# Patient Record
Sex: Male | Born: 1995 | Race: Black or African American | Hispanic: No | Marital: Single | Smoking: Never smoker
Health system: Southern US, Community
[De-identification: ages and names within clinical notes are randomized; demographics above are authoritative.]

---

## 2014-12-07 ENCOUNTER — Ambulatory Visit
Admit: 2014-12-07 | Disposition: A | Discharge status: Home / Self Care | Payer: Self-pay | Attending: Family Medicine | Admitting: Family Medicine

## 2015-07-23 ENCOUNTER — Encounter: Payer: Self-pay | Admitting: Family Medicine

## 2015-07-23 ENCOUNTER — Ambulatory Visit (INDEPENDENT_AMBULATORY_CARE_PROVIDER_SITE_OTHER): Payer: 59 | Admitting: Family Medicine

## 2015-07-23 VITALS — BP 118/72 | HR 64 | Temp 98.0°F | Resp 16 | Ht 72.0 in | Wt 187.0 lb

## 2015-07-23 DIAGNOSIS — L0202 Furuncle of face: Secondary | ICD-10-CM

## 2015-07-23 MED ORDER — CLINDAMYCIN PHOSPHATE 1 % EX LOTN: TOPICAL_LOTION | Freq: Two times a day (BID) | CUTANEOUS | Status: DC

## 2015-07-23 NOTE — Progress Notes (Signed)
   Subjective:    Patient ID: Dickie LaMarcus Glover Saunders, male    DOB: 1995-10-01, 19 y.o.   MRN: [id number]  HPI: Dickie LaMarcus Glover Saunders is a 19 y.o. male presenting on 07/23/2015 for Cyst   HPI  Pt presents for growth on chin. Noticed 1 week ago. Was larger yesterday but swelling has improved. Put cortisone on it at home. No pus or drainage. No fevers. No trouble swallowing. Not painful.   No past medical history on file.  No current outpatient prescriptions on file prior to visit.   No current facility-administered medications on file prior to visit.    Review of Systems  Constitutional: Negative for fever and chills.  Respiratory: Negative for cough, chest tightness and shortness of breath.   Cardiovascular: Negative for chest pain, palpitations and leg swelling.  Skin: Positive for wound.   Per HPI unless specifically indicated above     Objective:    BP 118/72 mmHg  Pulse 64  Temp(Src) 98 F (36.7 C) (Oral)  Resp 16  Ht 6' (1.829 m)  Wt 187 lb (84.823 kg)  BMI 25.36 kg/m2  Wt Readings from Last 3 Encounters:  07/23/15 187 lb (84.823 kg)    Physical Exam  Constitutional: He appears well-developed and well-nourished. No distress.  Cardiovascular: Normal rate and regular rhythm.  Exam reveals no gallop and no friction rub.   No murmur heard. Pulmonary/Chest: Effort normal and breath sounds normal. He has no wheezes. He exhibits no tenderness.  Skin: Skin is warm and dry. Lesion noted. He is not diaphoretic. There is erythema. No pallor.  Erythematous lesion with 1cm diameter under the chin. No pustule.    No results found for this or any previous visit.    Assessment & Plan:   Problem List Items Addressed This Visit    None    Visit Diagnoses    Furuncle of chin    -  Primary    Warm compresses to open up the lesion. Topical clindamycin. Consider I and D if not improving with conservative care.     Relevant Medications    clindamycin (CLEOCIN T) 1 % lotion       Meds ordered this encounter  Medications  . clindamycin (CLEOCIN T) 1 % lotion    Sig: Apply topically 2 (two) times daily.    Dispense:  60 mL    Refill:  1    Order Specific Question:  Supervising Provider    Answer:  Janeann ForehandHAWKINS JR, JAMES H [[id number]]      Follow up plan: Return if symptoms worsen or fail to improve.

## 2015-07-23 NOTE — Patient Instructions (Signed)

## 2016-05-01 ENCOUNTER — Encounter: Payer: Self-pay | Admitting: Family Medicine

## 2016-05-01 ENCOUNTER — Ambulatory Visit (INDEPENDENT_AMBULATORY_CARE_PROVIDER_SITE_OTHER): Payer: BLUE CROSS/BLUE SHIELD | Admitting: Family Medicine

## 2016-05-01 VITALS — BP 108/71 | HR 71 | Temp 98.6°F | Resp 16 | Ht 70.5 in | Wt 190.6 lb

## 2016-05-01 DIAGNOSIS — Z23 Encounter for immunization: Secondary | ICD-10-CM | POA: Diagnosis not present

## 2016-05-01 DIAGNOSIS — Z202 Contact with and (suspected) exposure to infections with a predominantly sexual mode of transmission: Secondary | ICD-10-CM

## 2016-05-01 DIAGNOSIS — Z0000 Encounter for general adult medical examination without abnormal findings: Secondary | ICD-10-CM | POA: Diagnosis not present

## 2016-05-01 NOTE — Addendum Note (Signed)
Addended by: Elvina MattesPATEL, Wenceslaus Gist D on: 05/01/2016 11:34 AM   Modules accepted: Orders

## 2016-05-01 NOTE — Patient Instructions (Addendum)
I do recommend seeing Eye doctor to have your vision checked.   Health Maintenance, Male A healthy lifestyle and preventative care can promote health and wellness.  Maintain regular health, dental, and eye exams.  Eat a healthy diet. Foods like vegetables, fruits, whole grains, low-fat dairy products, and lean protein foods contain the nutrients you need and are low in calories. Decrease your intake of foods high in solid fats, added sugars, and salt. Get information about a proper diet from your health care provider, if necessary.  Regular physical exercise is one of the most important things you can do for your health. Most adults should get at least 150 minutes of moderate-intensity exercise (any activity that increases your heart rate and causes you to sweat) each week. In addition, most adults need muscle-strengthening exercises on 2 or more days a week.   Maintain a healthy weight. The body mass index (BMI) is a screening tool to identify possible weight problems. It provides an estimate of body fat based on height and weight. Your health care provider can find your BMI and can help you achieve or maintain a healthy weight. For males 20 years and older:  A BMI below 18.5 is considered underweight.  A BMI of 18.5 to 24.9 is normal.  A BMI of 25 to 29.9 is considered overweight.  A BMI of 30 and above is considered obese.  Maintain normal blood lipids and cholesterol by exercising and minimizing your intake of saturated fat. Eat a balanced diet with plenty of fruits and vegetables. Blood tests for lipids and cholesterol should begin at age 22 and be repeated every 5 years. If your lipid or cholesterol levels are high, you are over age 22, or you are at high risk for heart disease, you may need your cholesterol levels checked more frequently.Ongoing high lipid and cholesterol levels should be treated with medicines if diet and exercise are not working.  If you smoke, find out from your  health care provider how to quit. If you do not use tobacco, do not start.  Lung cancer screening is recommended for adults aged 55-80 years who are at high risk for developing lung cancer because of a history of smoking. A yearly low-dose CT scan of the lungs is recommended for people who have at least a 30-pack-year history of smoking and are current smokers or have quit within the past 15 years. A pack year of smoking is smoking an average of 1 pack of cigarettes a day for 1 year (for example, a 30-pack-year history of smoking could mean smoking 1 pack a day for 30 years or 2 packs a day for 15 years). Yearly screening should continue until the smoker has stopped smoking for at least 15 years. Yearly screening should be stopped for people who develop a health problem that would prevent them from having lung cancer treatment.  If you choose to drink alcohol, do not have more than 2 drinks per day. One drink is considered to be 12 oz (360 mL) of beer, 5 oz (150 mL) of wine, or 1.5 oz (45 mL) of liquor.  Avoid the use of street drugs. Do not share needles with anyone. Ask for help if you need support or instructions about stopping the use of drugs.  High blood pressure causes heart disease and increases the risk of stroke. High blood pressure is more likely to develop in:  People who have blood pressure in the end of the normal range (100-139/85-89 mm Hg).  People who are overweight or obese.  People who are African American.  If you are 14-73 years of age, have your blood pressure checked every 3-5 years. If you are 50 years of age or older, have your blood pressure checked every year. You should have your blood pressure measured twice--once when you are at a hospital or clinic, and once when you are not at a hospital or clinic. Record the average of the two measurements. To check your blood pressure when you are not at a hospital or clinic, you can use:  An automated blood pressure machine at a  pharmacy.  A home blood pressure monitor.  If you are 69-22 years old, ask your health care provider if you should take aspirin to prevent heart disease.  Diabetes screening involves taking a blood sample to check your fasting blood sugar level. This should be done once every 3 years after age 16 if you are at a normal weight and without risk factors for diabetes. Testing should be considered at a younger age or be carried out more frequently if you are overweight and have at least 1 risk factor for diabetes.  Colorectal cancer can be detected and often prevented. Most routine colorectal cancer screening begins at the age of 44 and continues through age 73. However, your health care provider may recommend screening at an earlier age if you have risk factors for colon cancer. On a yearly basis, your health care provider may provide home test kits to check for hidden blood in the stool. A small camera at the end of a tube may be used to directly examine the colon (sigmoidoscopy or colonoscopy) to detect the earliest forms of colorectal cancer. Talk to your health care provider about this at age 24 when routine screening begins. A direct exam of the colon should be repeated every 5-10 years through age 74, unless early forms of precancerous polyps or small growths are found.  People who are at an increased risk for hepatitis B should be screened for this virus. You are considered at high risk for hepatitis B if:  You were born in a country where hepatitis B occurs often. Talk with your health care provider about which countries are considered high risk.  Your parents were born in a high-risk country and you have not received a shot to protect against hepatitis B (hepatitis B vaccine).  You have HIV or AIDS.  You use needles to inject street drugs.  You live with, or have sex with, someone who has hepatitis B.  You are a man who has sex with other men (MSM).  You get hemodialysis  treatment.  You take certain medicines for conditions like cancer, organ transplantation, and autoimmune conditions.  Hepatitis C blood testing is recommended for all people born from [birth year] through 1967 and any individual with known risk factors for hepatitis C.  Healthy men should no longer receive prostate-specific antigen (PSA) blood tests as part of routine cancer screening. Talk to your health care provider about prostate cancer screening.  Testicular cancer screening is not recommended for adolescents or adult males who have no symptoms. Screening includes self-exam, a health care provider exam, and other screening tests. Consult with your health care provider about any symptoms you have or any concerns you have about testicular cancer.  Practice safe sex. Use condoms and avoid high-risk sexual practices to reduce the spread of sexually transmitted infections (STIs).  You should be screened for STIs, including gonorrhea and chlamydia if:  You are sexually active and are younger than 24 years.  You are older than 24 years, and your health care provider tells you that you are at risk for this type of infection.  Your sexual activity has changed since you were last screened, and you are at an increased risk for chlamydia or gonorrhea. Ask your health care provider if you are at risk.  If you are at risk of being infected with HIV, it is recommended that you take a prescription medicine daily to prevent HIV infection. This is called pre-exposure prophylaxis (PrEP). You are considered at risk if:  You are a man who has sex with other men (MSM).  You are a heterosexual man who is sexually active with multiple partners.  You take drugs by injection.  You are sexually active with a partner who has HIV.  Talk with your health care provider about whether you are at high risk of being infected with HIV. If you choose to begin PrEP, you should first be tested for HIV. You should then be tested  every 3 months for as long as you are taking PrEP.  Use sunscreen. Apply sunscreen liberally and repeatedly throughout the day. You should seek shade when your shadow is shorter than you. Protect yourself by wearing long sleeves, pants, a wide-brimmed hat, and sunglasses year round whenever you are outdoors.  Tell your health care provider of new moles or changes in moles, especially if there is a change in shape or color. Also, tell your health care provider if a mole is larger than the size of a pencil eraser.  A one-time screening for abdominal aortic aneurysm (AAA) and surgical repair of large AAAs by ultrasound is recommended for men aged 44-75 years who are current or former smokers.  Stay current with your vaccines (immunizations).   This information is not intended to replace advice given to you by your health care provider. Make sure you discuss any questions you have with your health care provider.   Document Released: 01/31/2008 Document Revised: 08/25/2014 Document Reviewed: 12/30/2010 Elsevier Interactive Patient Education [employer].

## 2016-05-01 NOTE — Progress Notes (Signed)
Subjective:    Patient ID: Thomas Pierce, male    DOB: 1995/10/04, 20 y.o.   MRN: [id number]  HPI: Thomas Pierce is a 20 y.o. male presenting on 05/01/2016 for Annual Exam   HPI  Pt presents for physical today. 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 Will be living in an apartment off campus. Would like a physical for clearance for sports. No history of fainting on the field. Has never had a concussion or head injury. No family history of sudden cardiac death. No history of heart defects or issues. No history of broken bones or injuries.  Currently sexually active. 1 partner. Would like STI testing.  Trouble seeing road signs at a distance. Has not had eye exam in several years.  TDAP last year.  Would like flu shot today.    No past medical history on file. Social History   Social History  . Marital status: Single    Spouse name: N/A  . Number of children: N/A  . Years of education: N/A   Occupational History  . Not on file.   Social History Main Topics  . Smoking status: Never Smoker  . Smokeless tobacco: Never Used  . Alcohol use 0.0 oz/week     Comment: Occasionally   . Drug use: No  . Sexual activity: Not on file   Other Topics Concern  . Not on file   Social History Narrative  . No narrative on file   Family History  Problem Relation Age of Onset  . Cancer Maternal Grandmother   . Diabetes Paternal Grandmother    No current outpatient prescriptions on file prior to visit.   No current facility-administered medications on file prior to visit.     Review of Systems  Constitutional: Negative for chills and fever.  HENT: Negative.   Respiratory: Negative for chest tightness, shortness of breath and wheezing.   Cardiovascular: Negative for chest pain, palpitations and leg swelling.  Gastrointestinal: Negative for abdominal pain, nausea and vomiting.  Endocrine: Negative.   Genitourinary: Negative for discharge, dysuria, penile  pain, testicular pain and urgency.  Musculoskeletal: Negative for arthralgias, back pain and joint swelling.  Skin: Negative.   Neurological: Negative for dizziness, weakness, numbness and headaches.  Psychiatric/Behavioral: Negative for dysphoric mood and sleep disturbance.   Per HPI unless specifically indicated above     Objective:    BP 108/71 (BP Location: Right Arm, Patient Position: Sitting, Cuff Size: Large)   Pulse 71   Temp 98.6 F (37 C) (Oral)   Resp 16   Ht 5' 10.5" (1.791 m)   Wt 190 lb 9.6 oz (86.5 kg)   BMI 26.96 kg/m   Wt Readings from Last 3 Encounters:  05/01/16 190 lb 9.6 oz (86.5 kg)  07/23/15 187 lb (84.8 kg)    Physical Exam  Constitutional: He is oriented to person, place, and time. He appears well-developed and well-nourished. No distress.  HENT:  Head: Normocephalic and atraumatic.  Neck: Neck supple. No thyromegaly present.  Cardiovascular: Normal rate, regular rhythm and normal heart sounds.  Exam reveals no gallop and no friction rub.   No murmur heard. Pulmonary/Chest: Effort normal and breath sounds normal. He has no wheezes.  Abdominal: Soft. Bowel sounds are normal. He exhibits no distension. There is no tenderness. There is no rebound.  Musculoskeletal: Normal range of motion. He exhibits no edema or tenderness.  Neurological: He is alert and oriented to person, place, and time. He has normal  reflexes.  Skin: Skin is warm and dry. No rash noted. No erythema.  Psychiatric: He has a normal mood and affect. His behavior is normal. Thought content normal.   No results found for this or any previous visit.    Assessment & Plan:   Problem List Items Addressed This Visit    None    Visit Diagnoses    Annual physical exam    -  Primary   Health maintenance reviewed. Cleared for sports. requesting vaccine records to college documentation.    Relevant Orders   COMPLETE METABOLIC PANEL WITH GFR   Lipid Profile   Possible exposure to STD        Pt would like STD testing today.    Relevant Orders   GC/Chlamydia Probe Amp   HIV antibody (with reflex)   Hepatitis C Antibody   Flu vaccine need       Relevant Orders   Flu Vaccine QUAD 36+ mos PF IM (Fluarix & Fluzone Quad PF) (Completed)      No orders of the defined types were placed in this encounter.     Follow up plan: No Follow-up on file.

## 2016-05-02 ENCOUNTER — Telehealth: Payer: Self-pay | Admitting: Family Medicine

## 2016-05-02 LAB — GC/CHLAMYDIA PROBE AMP
CT Probe RNA: NOT DETECTED
GC PROBE AMP APTIMA: NOT DETECTED

## 2016-05-02 NOTE — Telephone Encounter (Signed)
Called pt to let him know that [facility] has no record of him. He needs to have his mother check for vaccine records at home. If he cannot find records- he will need to contact [employer] to determine which vaccines are needed for school so we can draw titers.

## 2016-05-06 ENCOUNTER — Encounter: Payer: Self-pay | Admitting: Family Medicine

## 2016-06-05 ENCOUNTER — Telehealth: Payer: Self-pay | Admitting: *Deleted

## 2016-06-05 NOTE — Telephone Encounter (Signed)
Noted-jh 

## 2016-06-05 NOTE — Telephone Encounter (Signed)
Patient has scheduled office visit for spirometry. He need clearance for [employer] proving he doesn't have asthma.

## 2016-06-10 ENCOUNTER — Encounter: Payer: Self-pay | Admitting: Family Medicine

## 2016-06-10 ENCOUNTER — Telehealth: Payer: Self-pay | Admitting: *Deleted

## 2016-06-10 ENCOUNTER — Ambulatory Visit (INDEPENDENT_AMBULATORY_CARE_PROVIDER_SITE_OTHER): Payer: BLUE CROSS/BLUE SHIELD | Admitting: Family Medicine

## 2016-06-10 VITALS — BP 121/74 | HR 75 | Temp 98.6°F | Resp 16 | Ht 70.5 in | Wt 192.0 lb

## 2016-06-10 DIAGNOSIS — Z0189 Encounter for other specified special examinations: Secondary | ICD-10-CM | POA: Diagnosis not present

## 2016-06-10 NOTE — Telephone Encounter (Signed)
PFT scheduled for 06/17/16 patient needs to arrive at [facility].

## 2016-06-10 NOTE — Patient Instructions (Signed)
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 If further testing needed, he can repeat [facility] at [facility].

## 2016-06-10 NOTE — Progress Notes (Signed)
Name: Thomas Pierce   MRN: [id number]    DOB: 12/10/1995   Date:06/10/2016       Progress Note  Subjective  Chief Complaint  Chief Complaint  Patient presents with  . spirometry    HPI Here for documention that he does not have asthma for entering [employer].   He had PFTs done 11/2014 which showed no obstructive airway disease and no bronchodilator effect.  He did have mild restrictive airway disease. No problem-specific Assessment & Plan notes found for this encounter.   History reviewed. No pertinent past medical history.  Social History  Substance Use Topics  . Smoking status: Never Smoker  . Smokeless tobacco: Never Used  . Alcohol use 0.0 oz/week     Comment: Occasionally     No current outpatient prescriptions on file.  Not on File  Review of Systems  Constitutional: Negative.   Respiratory: Negative.   Cardiovascular: Negative.       Objective  Vitals:   06/10/16 1448  BP: 121/74  Pulse: 75  Resp: 16  Temp: 98.6 F (37 C)  TempSrc: Oral  Weight: 192 lb (87.1 kg)  Height: 5' 10.5" (1.791 m)     Physical Exam  Constitutional: He is well-developed, well-nourished, and in no distress. No distress.  Cardiovascular: Normal rate, regular rhythm and normal heart sounds.  Exam reveals no gallop and no friction rub.   No murmur heard. Pulmonary/Chest: Effort normal and breath sounds normal. No respiratory distress. He has no wheezes. He has no rales.  Vitals reviewed.     Recent Results (from the past 2160 hour(s))  GC/Chlamydia Probe Amp     Status: None   Collection Time: 05/01/16 11:31 AM  Result Value Ref Range   CT Probe RNA NOT DETECTED     Comment:                    **Normal Reference Range: NOT DETECTED**   This test was performed using the APTIMA COMBO2 Assay ([employer]   The analytical performance characteristics of this assay, when used to test SurePath specimens have been determined by [facility]      GC  Probe RNA NOT DETECTED     Comment:                    **Normal Reference Range: NOT DETECTED**   This test was performed using the APTIMA COMBO2 Assay ([employer]   The analytical performance characteristics of this assay, when used to test SurePath specimens have been determined by [facility]        Assessment & Plan  1. Encounter for respiratory clearance examination  - Spirometry with graph; Future - Spirometry with graph - Pulmonary Function Test [facility] Only; Future  He will carry PFT results from 2016 to recruiter.  If this clears him, then that ends evaluation.  If further testing needed, he can get repeat PTT[facility] at [facility] as ordered.

## 2016-06-17 ENCOUNTER — Ambulatory Visit: Payer: Self-pay

## 2016-08-14 ENCOUNTER — Ambulatory Visit: Payer: BLUE CROSS/BLUE SHIELD | Attending: Family Medicine

## 2016-08-14 DIAGNOSIS — Z0189 Encounter for other specified special examinations: Secondary | ICD-10-CM

## 2016-08-14 DIAGNOSIS — Z1383 Encounter for screening for respiratory disorder NEC: Secondary | ICD-10-CM | POA: Insufficient documentation

## 2016-09-04 ENCOUNTER — Encounter: Payer: Self-pay | Admitting: Family Medicine

## 2017-01-28 ENCOUNTER — Encounter: Payer: Self-pay | Admitting: Family Medicine

## 2017-12-29 ENCOUNTER — Other Ambulatory Visit: Payer: Self-pay | Admitting: Occupational Medicine

## 2017-12-29 ENCOUNTER — Ambulatory Visit
Admission: RE | Admit: 2017-12-29 | Discharge: 2017-12-29 | Disposition: A | Discharge status: Home / Self Care | Payer: No Typology Code available for payment source | Source: Ambulatory Visit | Attending: Occupational Medicine | Admitting: Occupational Medicine

## 2017-12-29 DIAGNOSIS — Z021 Encounter for pre-employment examination: Secondary | ICD-10-CM

## 2018-10-02 ENCOUNTER — Ambulatory Visit: Payer: 59 | Admitting: Sports Medicine

## 2018-10-16 ENCOUNTER — Ambulatory Visit (INDEPENDENT_AMBULATORY_CARE_PROVIDER_SITE_OTHER): Payer: 59

## 2018-10-16 ENCOUNTER — Encounter: Payer: Self-pay | Admitting: Podiatry

## 2018-10-16 ENCOUNTER — Ambulatory Visit: Payer: 59 | Admitting: Podiatry

## 2018-10-16 DIAGNOSIS — S99922A Unspecified injury of left foot, initial encounter: Secondary | ICD-10-CM | POA: Diagnosis not present

## 2018-10-16 DIAGNOSIS — B353 Tinea pedis: Secondary | ICD-10-CM | POA: Diagnosis not present

## 2018-10-16 MED ORDER — CLOTRIMAZOLE-BETAMETHASONE 1-0.05 % EX CREA: 1.0000 "application " | TOPICAL_CREAM | Freq: Two times a day (BID) | CUTANEOUS | 1 refills | Status: AC

## 2018-10-16 MED ORDER — TERBINAFINE HCL 250 MG PO TABS: 250.0000 mg | ORAL_TABLET | Freq: Every day | ORAL | 0 refills | Status: AC

## 2018-10-24 NOTE — Progress Notes (Signed)
   HPI: 23 year old male presenting today as a new patient with a chief complaint of a splinter noted to the left foot that he got 2-3 weeks ago after accidentally stepping on a pencil. He states he was able to remove the splinter himself. He reports associated soreness of the area that has since resolved.  He also complains of dry, peeling skin of the left foot that has been present for the past several weeks. He has been applying Vaseline to the foot for treatment. He denies any modifying factors of his symptoms. Patient is here for further evaluation and treatment.   No past medical history on file.   Physical Exam: General: The patient is alert and oriented x3 in no acute distress.  Dermatology: Pruritus noted to the left foot with hyperkeratosis. Skin is warm, dry and supple bilateral lower extremities. Negative for open lesions or macerations.  Vascular: Palpable pedal pulses bilaterally. No edema or erythema noted. Capillary refill within normal limits.  Neurological: Epicritic and protective threshold grossly intact bilaterally.   Musculoskeletal Exam: Range of motion within normal limits to all pedal and ankle joints bilateral. Muscle strength 5/5 in all groups bilateral.   Radiographic Exam:  Normal osseous mineralization. Joint spaces preserved. No fracture/dislocation/boney destruction.    Assessment: 1. Puncture injury left - resolved  2. Tinea Pedis left    Plan of Care:  1. Patient evaluated. X-Rays reviewed.  2. Prescription for Lamisil 250 mg #28 provided to patient.  3. Prescription for Lotrisone cream provided to patient.  4. Recommended good foot hygiene.  5. Return to clinic as needed.       Felecia Shelling, DPM [facility]  Dr. Felecia Shelling, DPM    [address] [place], [place] [address]                Office [phone]  Fax [phone]

## 2018-11-07 ENCOUNTER — Emergency Department (HOSPITAL_COMMUNITY)
Admission: EM | Admit: 2018-11-07 | Discharge: 2018-11-07 | Disposition: A | Discharge status: Home / Self Care | Payer: 59 | Attending: Emergency Medicine | Admitting: Emergency Medicine

## 2018-11-07 ENCOUNTER — Encounter (HOSPITAL_COMMUNITY): Payer: Self-pay | Admitting: Obstetrics and Gynecology

## 2018-11-07 ENCOUNTER — Other Ambulatory Visit: Payer: Self-pay

## 2018-11-07 ENCOUNTER — Emergency Department (HOSPITAL_COMMUNITY): Payer: 59

## 2018-11-07 DIAGNOSIS — K0889 Other specified disorders of teeth and supporting structures: Secondary | ICD-10-CM | POA: Insufficient documentation

## 2018-11-07 DIAGNOSIS — G43909 Migraine, unspecified, not intractable, without status migrainosus: Secondary | ICD-10-CM | POA: Diagnosis not present

## 2018-11-07 DIAGNOSIS — R51 Headache: Secondary | ICD-10-CM | POA: Diagnosis present

## 2018-11-07 DIAGNOSIS — Z79899 Other long term (current) drug therapy: Secondary | ICD-10-CM | POA: Insufficient documentation

## 2018-11-07 LAB — CBC
HCT: 45.7 % (ref 39.0–52.0)
Hemoglobin: 14.8 g/dL (ref 13.0–17.0)
MCH: 27.3 pg (ref 26.0–34.0)
MCHC: 32.4 g/dL (ref 30.0–36.0)
MCV: 84.3 fL (ref 80.0–100.0)
Platelets: 261 10*3/uL (ref 150–400)
RBC: 5.42 MIL/uL (ref 4.22–5.81)
RDW: 12.1 % (ref 11.5–15.5)
WBC: 3.9 10*3/uL — ABNORMAL LOW (ref 4.0–10.5)
nRBC: 0 % (ref 0.0–0.2)

## 2018-11-07 LAB — BASIC METABOLIC PANEL
ANION GAP: 9 (ref 5–15)
BUN: 16 mg/dL (ref 6–20)
CO2: 26 mmol/L (ref 22–32)
Calcium: 9.4 mg/dL (ref 8.9–10.3)
Chloride: 103 mmol/L (ref 98–111)
Creatinine, Ser: 1.36 mg/dL — ABNORMAL HIGH (ref 0.61–1.24)
GFR calc Af Amer: >60 mL/min (ref >60)
GFR calc non Af Amer: >60 mL/min (ref >60)
Glucose, Bld: 94 mg/dL (ref 70–99)
Potassium: 3.6 mmol/L (ref 3.5–5.1)
Sodium: 138 mmol/L (ref 135–145)

## 2018-11-07 MED ORDER — KETOROLAC TROMETHAMINE 30 MG/ML IJ SOLN
30.0000 mg | Freq: Once | INTRAMUSCULAR | Status: AC
  Administered 2018-11-07: 30 mg via INTRAVENOUS
  Filled 2018-11-07: qty 1

## 2018-11-07 MED ORDER — ONDANSETRON HCL 4 MG/2ML IJ SOLN
4.0000 mg | Freq: Once | INTRAMUSCULAR | Status: AC
  Administered 2018-11-07: 4 mg via INTRAVENOUS
  Filled 2018-11-07: qty 2

## 2018-11-07 MED ORDER — PROCHLORPERAZINE EDISYLATE 10 MG/2ML IJ SOLN
10.0000 mg | Freq: Once | INTRAMUSCULAR | Status: AC
  Administered 2018-11-07: 10 mg via INTRAVENOUS
  Filled 2018-11-07: qty 2

## 2018-11-07 NOTE — ED Notes (Signed)
Pt reports he has undiagnosed anxiety and his wife is about to have their first child and he is stressed about that and work. Pt reports that he feels like all the stress is just piling up and he has a really bad migraine.

## 2018-11-07 NOTE — ED Triage Notes (Signed)
Pt reports to the ED for migraine that is causing the pt difficulty keeping his balance. Pt reports he was having dental pain and his glasses were too tight. Pt reports he felt dizzy due to the migraine.

## 2018-11-07 NOTE — ED Provider Notes (Signed)
[facility] [place] [facility]-EMERGENCY DEPT Provider Note   CSN: [id number] Arrival date & time: 11/07/18  2049    History   Chief Complaint Chief Complaint  Patient presents with   Migraine   Dental Pain    HPI Thomas Pierce is a 23 y.o. male.     HPI Patient is a 23 year old male presents to the emergency department with complaints of sudden onset headache that began this evening while communicating with his boss.  He states worsening frontal headache since then.  He felt like maybe his glasses were too tight and took these off but it did not improve his symptoms.  He then began to feel somewhat lightheaded and dizzy.  He does have a history of migraine headaches but reports this is more severe than usual.  He is 23 years old and recently started a new job with [employer].  His wife is [redacted] weeks pregnant and he reports that he is somewhat stressed regarding the birth of his first child.  He denies weakness of his arms at this time but reports generalized weakness in his bilateral lower extremities equally.  He denies back pain.  No abdominal pain.  No chest pain or shortness of breath.   No past medical history on file.  There are no active problems to display for this patient.   No past surgical history on file.      Home Medications    Prior to Admission medications   Medication Sig Start Date End Date Taking? Authorizing Provider  clotrimazole-betamethasone (LOTRISONE) cream Apply 1 application topically 2 (two) times daily. 10/16/18   Felecia Shelling, DPM  terbinafine (LAMISIL) 250 MG tablet Take 1 tablet (250 mg total) by mouth daily. 10/16/18   Felecia Shelling, DPM    Family History Family History  Problem Relation Age of Onset   Cancer Maternal Grandmother    Diabetes Paternal Grandmother     Social History Social History   Tobacco Use   Smoking status: Never Smoker   Smokeless tobacco: Never Used  Substance Use Topics    Alcohol use: Yes    Alcohol/week: 0.0 standard drinks    Comment: Occasionally    Drug use: No     Allergies   Patient has no known allergies.   Review of Systems Review of Systems  All other systems reviewed and are negative.    Physical Exam Updated Vital Signs BP (!) 134/91 (BP Location: Left Arm)    Pulse 89    Temp 98.5 F (36.9 C) (Oral)    Resp 18    SpO2 98%   Physical Exam Vitals signs and nursing note reviewed.  Constitutional:      Appearance: He is well-developed.  HENT:     Head: Normocephalic and atraumatic.  Neck:     Musculoskeletal: Normal range of motion.  Cardiovascular:     Rate and Rhythm: Normal rate and regular rhythm.     Heart sounds: Normal heart sounds.  Pulmonary:     Effort: Pulmonary effort is normal. No respiratory distress.     Breath sounds: Normal breath sounds.  Abdominal:     General: There is no distension.     Palpations: Abdomen is soft.     Tenderness: There is no abdominal tenderness.  Musculoskeletal: Normal range of motion.  Skin:    General: Skin is warm and dry.  Neurological:     Mental Status: He is alert and oriented to person, place, and time.  Psychiatric:        Judgment: Judgment normal.      ED Treatments / Results  Labs (all labs ordered are listed, but only abnormal results are displayed) Labs Reviewed  CBC - Abnormal; Notable for the following components:      Result Value   WBC 3.9 (*)    All other components within normal limits  BASIC METABOLIC PANEL - Abnormal; Notable for the following components:   Creatinine, Ser 1.36 (*)    All other components within normal limits    EKG None  Radiology Ct Head Wo Contrast  Result Date: 11/07/2018 CLINICAL DATA:  Headache, acute, severe, worst HA of life. Vision changes. EXAM: CT HEAD WITHOUT CONTRAST TECHNIQUE: Contiguous axial images were obtained from the base of the skull through the vertex without intravenous contrast. COMPARISON:  None.  FINDINGS: Brain: No intracranial hemorrhage, mass effect, or midline shift. No hydrocephalus. The basilar cisterns are patent. No evidence of territorial infarct or acute ischemia. No extra-axial or intracranial fluid collection. Vascular: No hyperdense vessel or unexpected calcification. Skull: Normal. Negative for fracture or focal lesion. Sinuses/Orbits: Paranasal sinuses and mastoid air cells are clear. The visualized orbits are unremarkable. Other: None. IMPRESSION: Normal noncontrast head CT. Electronically Signed   By: Narda Rutherford M.D.   On: 11/07/2018 21:44    Procedures Procedures (including critical care time)  Medications Ordered in ED Medications  prochlorperazine (COMPAZINE) injection 10 mg (10 mg Intravenous Given 11/07/18 2126)  ondansetron (ZOFRAN) injection 4 mg (4 mg Intravenous Given 11/07/18 2126)  ketorolac (TORADOL) 30 MG/ML injection 30 mg (30 mg Intravenous Given 11/07/18 2126)     Initial Impression / Assessment and Plan / ED Course  I have reviewed the triage vital signs and the nursing notes.  Pertinent labs & imaging results that were available during my care of the patient were reviewed by me and considered in my medical decision making (see chart for details).        Patient is overall well-appearing here in the emergency department.  He has nonfocal neurologic exam.  His labs and head CT are reassuring.  Patient given a migraine cocktail.  Feels much better at this time.  Resolution of his headache.  Primary care follow-up.  Patient understands return to the ER for new or worsening symptoms  Final Clinical Impressions(s) / ED Diagnoses   Final diagnoses:  Migraine without status migrainosus, not intractable, unspecified migraine type    ED Discharge Orders    None       Azalia Bilis, MD 11/07/18 2212

## 2018-11-07 NOTE — ED Notes (Signed)
Patient given discharge teaching and verbalized understanding. Patient ambulated out of ED with a steady gait. 

## 2019-11-09 IMAGING — CT CT HEAD WITHOUT CONTRAST
3 series · 15 of 47 positions shown, 18 images · non-contrast
Comparison: None.

CLINICAL DATA: Headache, acute, severe, worst HA of life. Vision
changes.

EXAM:
CT HEAD WITHOUT CONTRAST
TECHNIQUE: Contiguous axial images were obtained from the base of the skull
through the vertex without intravenous contrast.

[Series 2: head wo · axial · 0.47mm/px · z∈[+1396,+1526]mm · 9 of 32 slices shown, 12 images]
[im 3/32  brain]
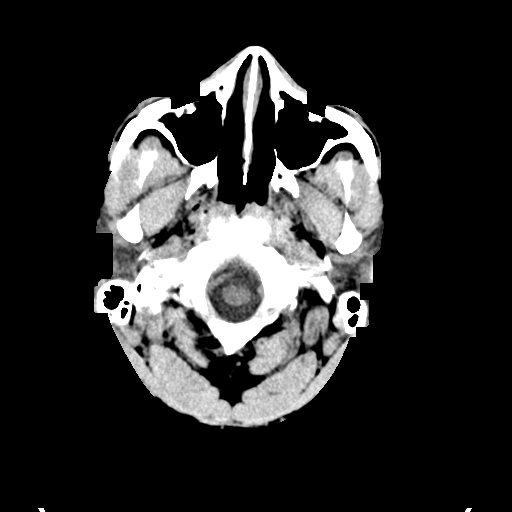
[im 3/32  bone]
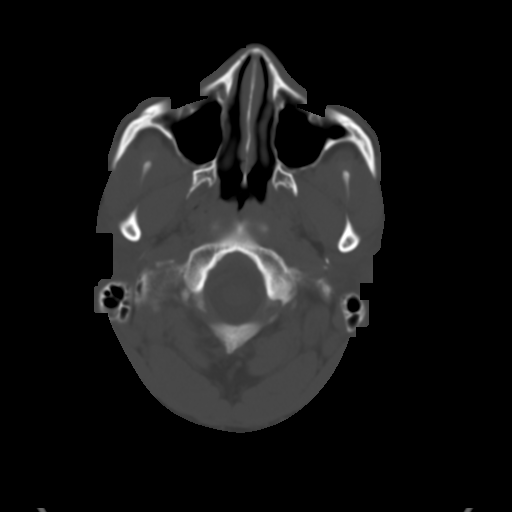
[im 6/32  brain]
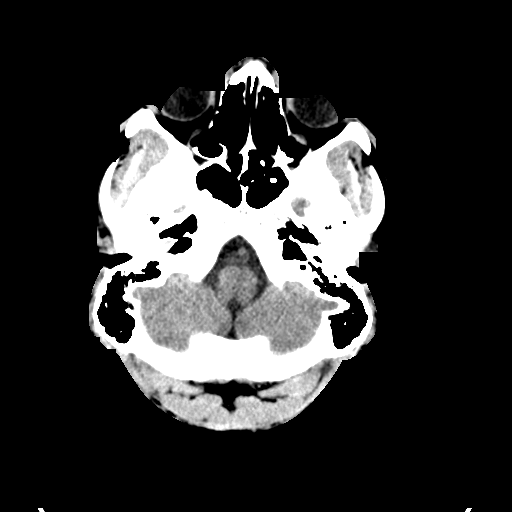
[im 9/32  brain]
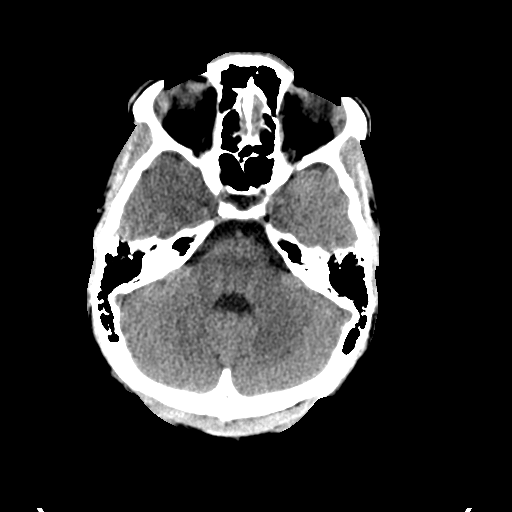
[im 12/32  brain]
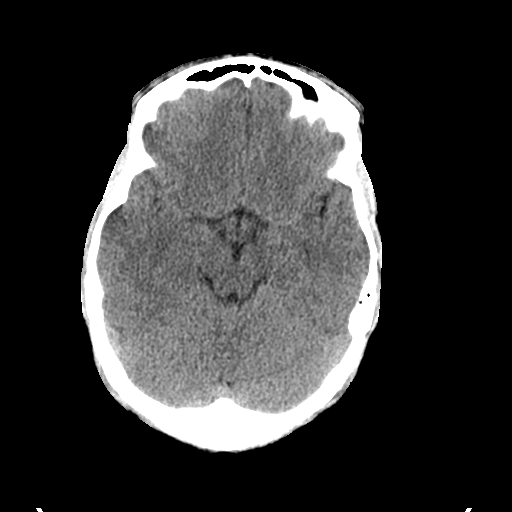
[im 17/32  brain]
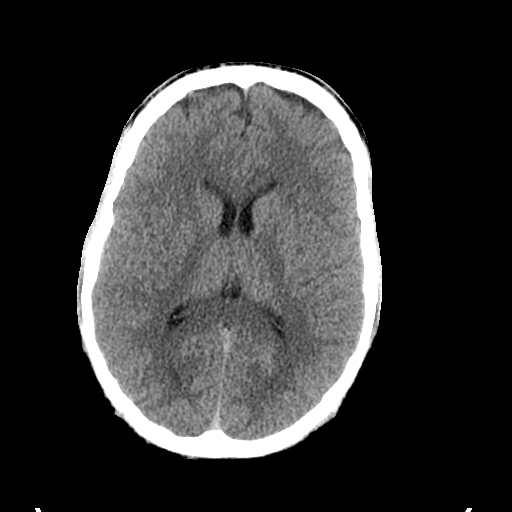
[im 17/32  bone]
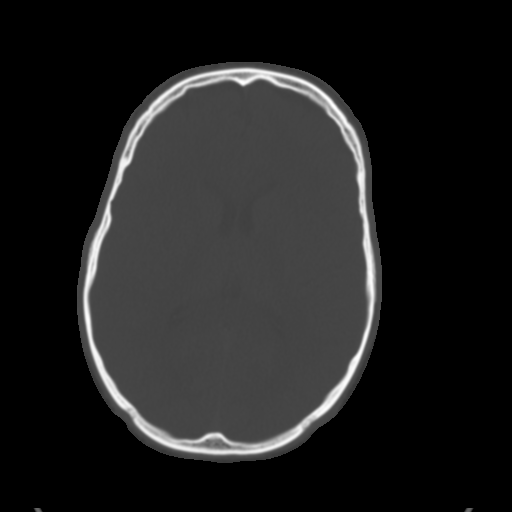
[im 20/32  brain]
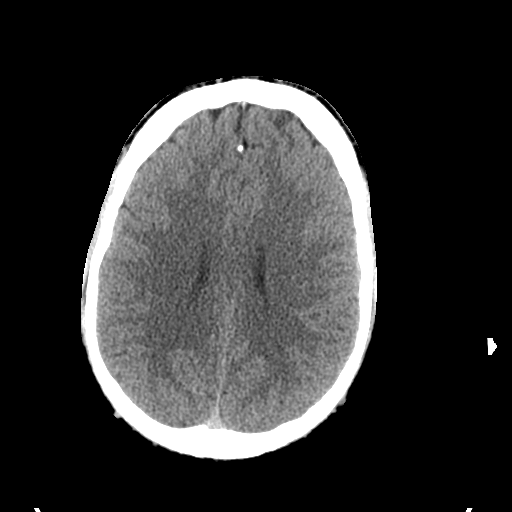
[im 23/32  brain]
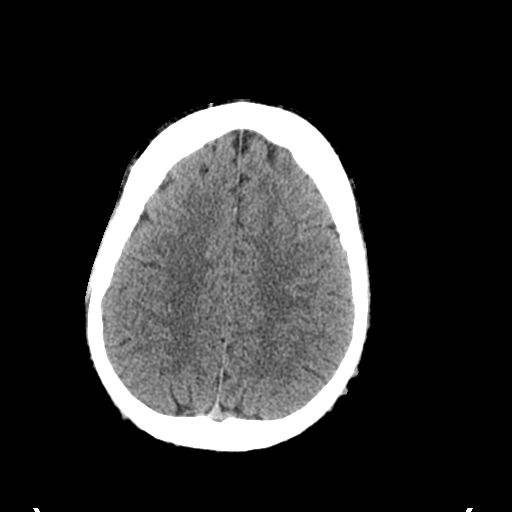
[im 26/32  brain]
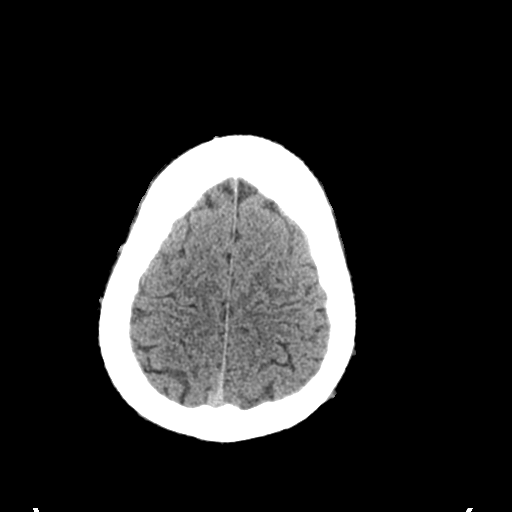
[im 29/32  brain]
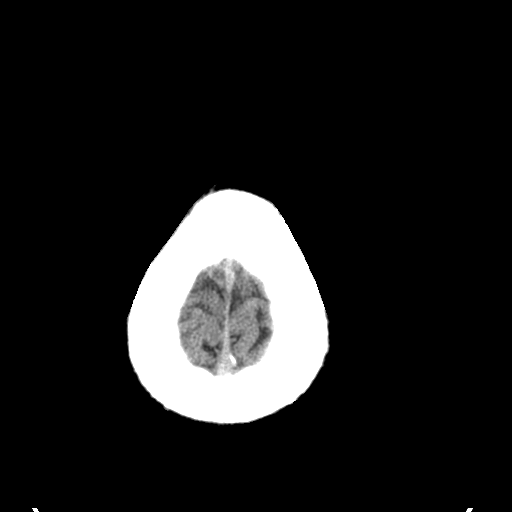
[im 29/32  bone]
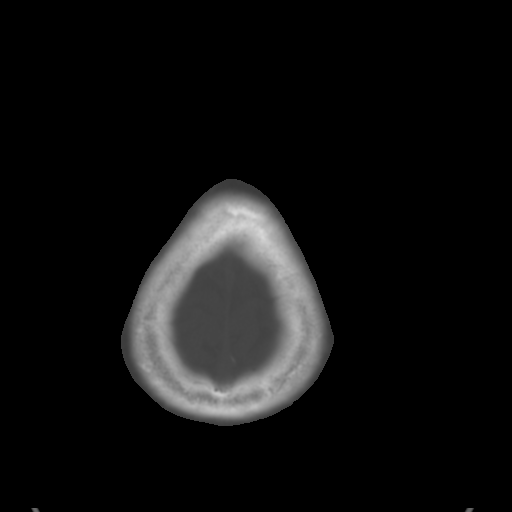

[Series 5: coronal soft tissue · coronal · 0.31mm/px · 3 of 73 slices shown]
[im 25/73  brain]
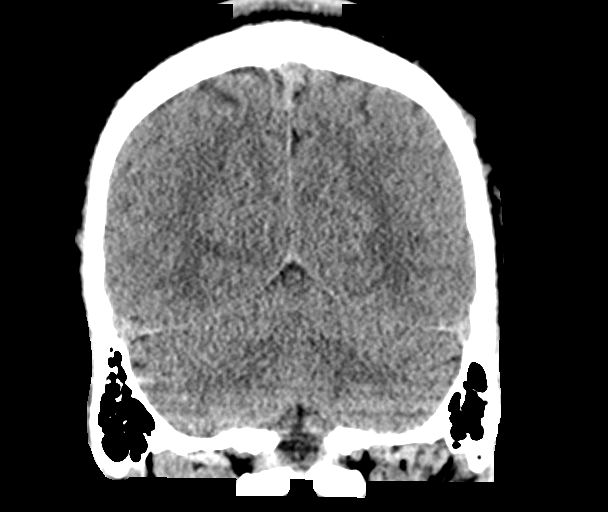
[im 33/73  brain]
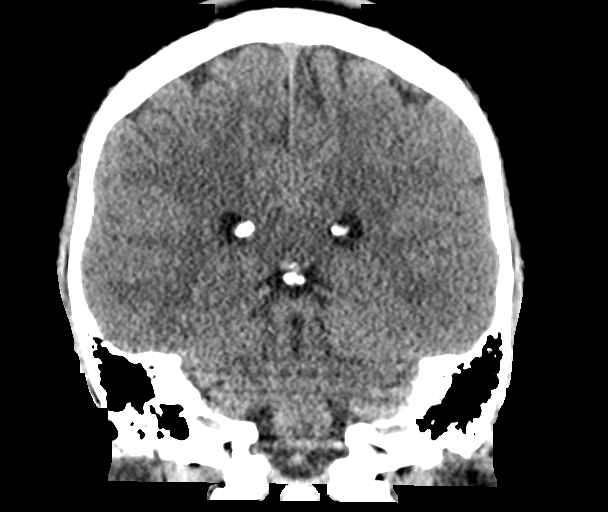
[im 41/73  brain]
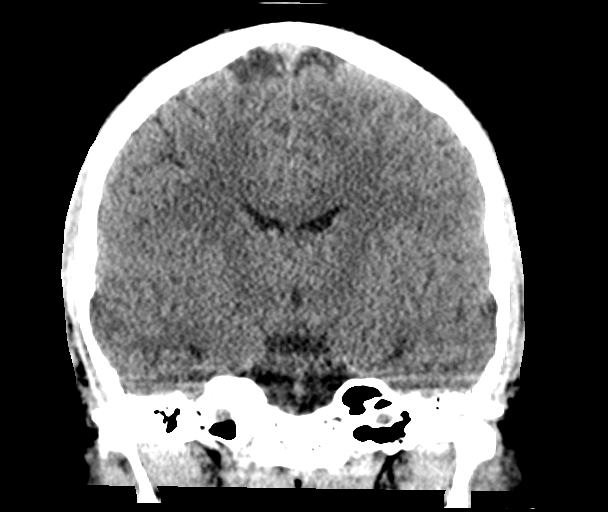

[Series 6: sagittal soft tissue · sagittal · 0.31mm/px · 3 of 63 slices shown]
[im 21/63  brain]
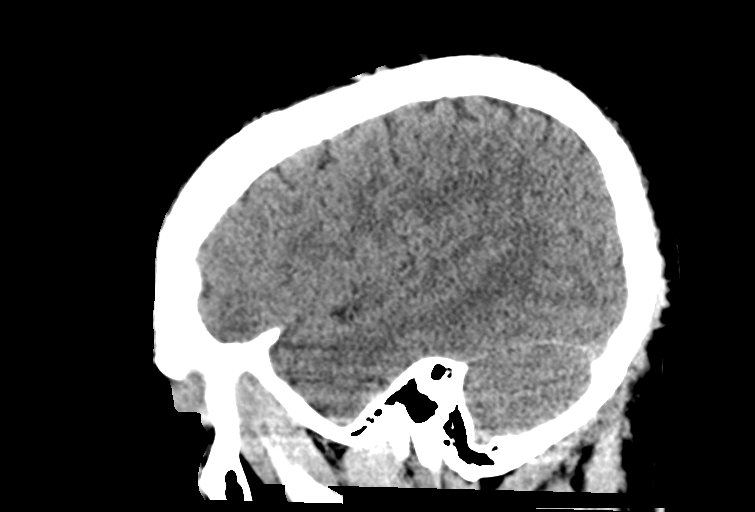
[im 32/63  brain]
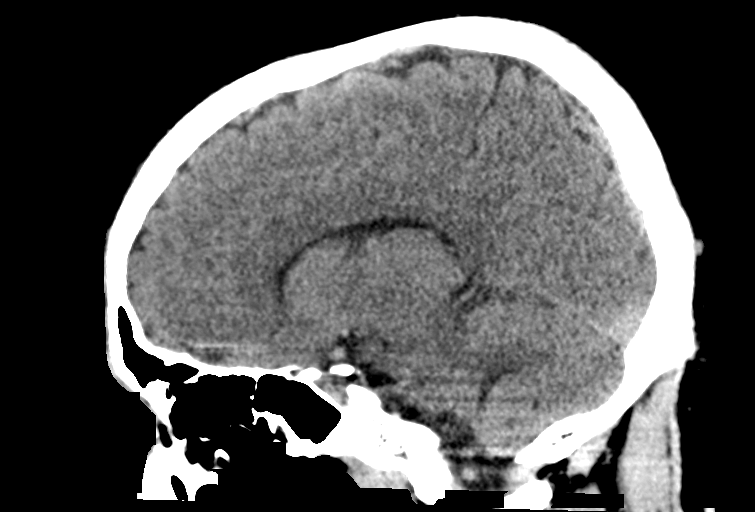
[im 42/63  brain]
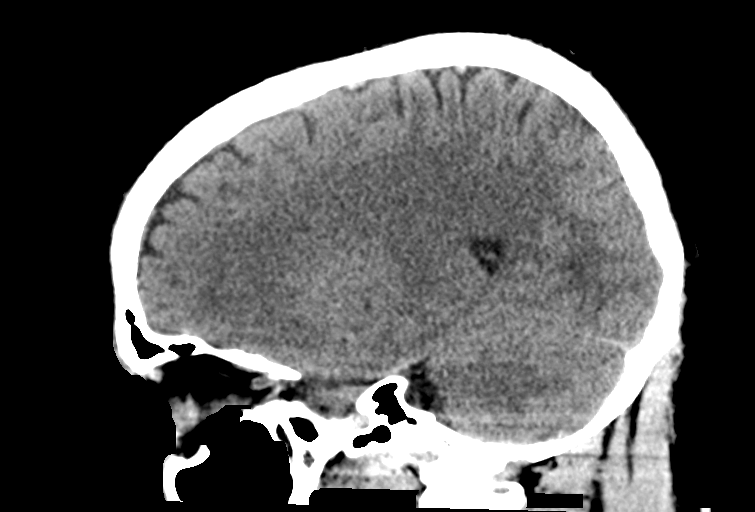

[15 of 47 positions shown; findings below may reference images not displayed]

FINDINGS: Brain: No intracranial hemorrhage, mass effect, or midline shift. No
hydrocephalus. The basilar cisterns are patent. No evidence of
territorial infarct or acute ischemia. No extra-axial or
intracranial fluid collection.

Vascular: No hyperdense vessel or unexpected calcification.

Skull: Normal. Negative for fracture or focal lesion.

Sinuses/Orbits: Paranasal sinuses and mastoid air cells are clear.
The visualized orbits are unremarkable.

Other: None.
IMPRESSION: Normal noncontrast head CT.
